# Patient Record
Sex: Male | Born: 1960 | Race: White | Hispanic: No | Marital: Married
Health system: Southern US, Community
[De-identification: ages and names within clinical notes are randomized; demographics above are authoritative.]

## PROBLEM LIST (undated history)

## (undated) DIAGNOSIS — I10 Essential (primary) hypertension: Secondary | ICD-10-CM

## (undated) DIAGNOSIS — I639 Cerebral infarction, unspecified: Secondary | ICD-10-CM

## (undated) HISTORY — PX: REPLACEMENT TOTAL KNEE: SUR1224

---

## 2020-08-08 ENCOUNTER — Emergency Department: Payer: No Typology Code available for payment source

## 2020-08-08 ENCOUNTER — Emergency Department
Admission: EM | Admit: 2020-08-08 | Discharge: 2020-08-09 | Disposition: A | Discharge status: Home / Self Care | Payer: No Typology Code available for payment source | Attending: Emergency Medicine | Admitting: Emergency Medicine

## 2020-08-08 ENCOUNTER — Other Ambulatory Visit: Payer: Self-pay

## 2020-08-08 DIAGNOSIS — Z9884 Bariatric surgery status: Secondary | ICD-10-CM | POA: Diagnosis not present

## 2020-08-08 DIAGNOSIS — Z8679 Personal history of other diseases of the circulatory system: Secondary | ICD-10-CM | POA: Insufficient documentation

## 2020-08-08 DIAGNOSIS — R109 Unspecified abdominal pain: Secondary | ICD-10-CM

## 2020-08-08 DIAGNOSIS — K7460 Unspecified cirrhosis of liver: Secondary | ICD-10-CM

## 2020-08-08 DIAGNOSIS — R1013 Epigastric pain: Secondary | ICD-10-CM

## 2020-08-08 LAB — HEPATIC FUNCTION PANEL
ALT: 38 U/L (ref 0–44)
AST: 81 U/L — ABNORMAL HIGH (ref 15–41)
Albumin: 3.7 g/dL (ref 3.5–5.0)
Alkaline Phosphatase: 149 U/L — ABNORMAL HIGH (ref 38–126)
Bilirubin, Direct: 0.2 mg/dL (ref 0.0–0.2)
Indirect Bilirubin: 0.6 mg/dL (ref 0.3–0.9)
Total Bilirubin: 0.8 mg/dL (ref 0.3–1.2)
Total Protein: 7.4 g/dL (ref 6.5–8.1)

## 2020-08-08 LAB — CBC
HCT: 43.6 % (ref 39.0–52.0)
Hemoglobin: 14.4 g/dL (ref 13.0–17.0)
MCH: 29.1 pg (ref 26.0–34.0)
MCHC: 33 g/dL (ref 30.0–36.0)
MCV: 88.3 fL (ref 80.0–100.0)
Platelets: 265 10*3/uL (ref 150–400)
RBC: 4.94 MIL/uL (ref 4.22–5.81)
RDW: 13.6 % (ref 11.5–15.5)
WBC: 16.2 10*3/uL — ABNORMAL HIGH (ref 4.0–10.5)
nRBC: 0 % (ref 0.0–0.2)

## 2020-08-08 LAB — BASIC METABOLIC PANEL
Anion gap: 10 (ref 5–15)
BUN: 32 mg/dL — ABNORMAL HIGH (ref 8–23)
CO2: 24 mmol/L (ref 22–32)
Calcium: 8.7 mg/dL — ABNORMAL LOW (ref 8.9–10.3)
Chloride: 105 mmol/L (ref 98–111)
Creatinine, Ser: 1.1 mg/dL (ref 0.61–1.24)
GFR, Estimated: >60 mL/min (ref >60)
Glucose, Bld: 93 mg/dL (ref 70–99)
Potassium: 3.2 mmol/L — ABNORMAL LOW (ref 3.5–5.1)
Sodium: 139 mmol/L (ref 135–145)

## 2020-08-08 LAB — TROPONIN I (HIGH SENSITIVITY): Troponin I (High Sensitivity): 5 ng/L (ref <18)

## 2020-08-08 LAB — LIPASE, BLOOD: Lipase: 75 U/L — ABNORMAL HIGH (ref 11–51)

## 2020-08-08 MED ORDER — IOHEXOL 350 MG/ML SOLN
100.0000 mL | Freq: Once | INTRAVENOUS | Status: AC | PRN
  Administered 2020-08-08: 100 mL via INTRAVENOUS

## 2020-08-08 NOTE — ED Provider Notes (Signed)
[facility] Emergency Department Provider Note    ____________________________________________   I have reviewed the triage vital signs and the nursing notes.   HISTORY  Chief Complaint Abdominal Pain   History limited by: Not Limited   HPI Julian Villa is a 59 y.o. male who presents to the emergency department today because of concerns for abdominal pain.  Located in the epigastric region.  He states it came on suddenly roughly 30 minutes prior to arrival.  It was severe.  He denied any associated nausea or vomiting.  Denied any associated shortness of breath.  Was given pain medication by EMS in the time my exam stated that he felt significant improvement.  He does have a history of AAA and states that has not been repaired.  Also has a history of gastric bypass and gastritis causing blood loss and requiring transfusion secondary to over-the-counter pain medication use.Marland Kitchen  Recently moved to the area from [place] where he had gotten his care in the past.  He has not yet established care here although states he has an appointment with his new doctor in 2 days. Denies any recent OTC pain medication use. Did have some alcohol recently.   No previous medical records in [facility] EMR  No past medical history on file.  There are no problems to display for this patient.   Prior to Admission medications   Not on File    Allergies Patient has no known allergies.  No family history on file.  Social History Social History   Tobacco Use  . Smoking status: Not on file  Substance Use Topics  . Alcohol use: Not on file  . Drug use: Not on file    Review of Systems Constitutional: No fever/chills Eyes: No visual changes. ENT: No sore throat. Cardiovascular: Denies chest pain. Respiratory: Denies shortness of breath. Gastrointestinal: Positive for abdominal pain. No nausea, no vomiting.  No diarrhea.   Genitourinary: Negative for dysuria. Musculoskeletal:  Negative for back pain. Skin: Negative for rash. Neurological: Negative for headaches, focal weakness or numbness.  ____________________________________________   PHYSICAL EXAM:  VITAL SIGNS: ED Triage Vitals  Enc Vitals Group     BP 08/08/20 2211 106/76     Pulse Rate 08/08/20 2208 69     Resp 08/08/20 2208 16     Temp 08/08/20 2211 97.6 F (36.4 C)     Temp Source 08/08/20 2208 Oral     SpO2 08/08/20 2211 97 %     Weight 08/08/20 2208 223 lb (101.2 kg)     Height 08/08/20 2208 5\' 11"  (1.803 m)     Head Circumference --      Peak Flow --      Pain Score 08/08/20 2208 3   Constitutional: Alert and oriented.  Eyes: Conjunctivae are normal.  ENT      Head: Normocephalic and atraumatic.      Nose: No congestion/rhinnorhea.      Mouth/Throat: Mucous membranes are moist.      Neck: No stridor. Hematological/Lymphatic/Immunilogical: No cervical lymphadenopathy. Cardiovascular: Normal rate, regular rhythm.  No murmurs, rubs, or gallops.  Respiratory: Normal respiratory effort without tachypnea nor retractions. Breath sounds are clear and equal bilaterally. No wheezes/rales/rhonchi. Gastrointestinal: Soft and non tender. No rebound. No guarding.  Genitourinary: Deferred Musculoskeletal: Normal range of motion in all extremities. No lower extremity edema. Neurologic:  Normal speech and language. No gross focal neurologic deficits are appreciated.  Skin:  Skin is warm, dry and intact. No rash  noted. Psychiatric: Mood and affect are normal. Speech and behavior are normal. Patient exhibits appropriate insight and judgment.  ____________________________________________    LABS (pertinent positives/negatives)  CBC wbc 16.2, hgb 14.4, plt 265 BMP na 139, k 3.2, cr 1.10 Trop hs 5 ____________________________________________   EKG  I, Phineas Semen, attending physician, personally viewed and interpreted this EKG  EKG Time: 2209 Rate: 69 Rhythm: atrial fibrillation Axis:  left axis deviation Intervals: qtc 459 QRS: LAFB ST changes: no st elevation Impression: abnormal ekg   ____________________________________________    RADIOLOGY  CXR No active disease  ____________________________________________   PROCEDURES  Procedures  ____________________________________________   INITIAL IMPRESSION / ASSESSMENT AND PLAN / ED COURSE  Pertinent labs & imaging results that were available during my care of the patient were reviewed by me and considered in my medical decision making (see chart for details).   Patient presented to the emergency department today because of concerns for abdominal pain.  The patient states that the pain started suddenly.  However by the time my exam the pain essentially resolved.  Had received some opioids by EMS.  Nontender abdomen.  Blood work shows a mild leukocytosis.  Given history of AAA will get CT imaging to evaluate.  Would also like to evaluate abdomen given history of gastric bypass.  ____________________________________________   FINAL CLINICAL IMPRESSION(S) / ED DIAGNOSES  Final diagnoses:  Abdominal pain, unspecified abdominal location     Note: This dictation was prepared with Dragon dictation. Any transcriptional errors that result from this process are unintentional     Phineas Semen, MD 08/08/20 2308

## 2020-08-08 NOTE — ED Triage Notes (Signed)
Pt with sudden onset of upper mid abd pain approx 30 min pta. Per ems pt with history of AAA. Pt received fentanyl with pain improvement. Pt denies shob, dizziness, nausea

## 2020-08-08 NOTE — ED Notes (Signed)
Pt updated on ct process.

## 2020-08-09 LAB — TROPONIN I (HIGH SENSITIVITY): Troponin I (High Sensitivity): 6 ng/L (ref <18)

## 2020-08-09 NOTE — ED Notes (Signed)
Pt up to void.

## 2020-08-09 NOTE — ED Provider Notes (Signed)
Accepted care of this patient from Dr. Derrill Kay at 11 PM.  Patient presented with sudden onset of epigastric abdominal pain which had resolved upon arrival to the emergency room.  Patient had history of AAA and therefore CT was pending.  I was asked to follow-up the results of his CT and reassess patient.  CT is negative for any evidence of AAA, dissection, or any other aneurysm.  Patient does have findings consistent with cirrhosis of the liver.  There is no history of alcohol abuse.  This findings were discussed with patient who has an appointment coming up with his primary care doctor this week.  Recommended follow-up with the PCP for further work-up of this finding.  His blood work was also reviewed by me with leukocytosis with white count of 16, AST of 81, lipase of 75.  Unfortunately since patient just recently moved here from who I am unable to locate his old medical records.  Patient denies any known history of cirrhosis of the liver.  He remains with no further episodes of pain, abdomen is soft and nontender.  I did do a second troponin which was also negative.  Therefore full resolution of his symptoms and a negative CT patient is stable for discharge with outpatient follow-up.  Plan was discussed with patient and his wife was at bedside.  Discussed my standard return precautions with both of them.    I have personally reviewed the images performed during this visit and I agree with the Radiologist's read.   Interpretation by Radiologist:  DG Chest 1 View  Result Date: 08/08/2020 CLINICAL DATA:  Sudden onset upper mid abdominal pain EXAM: CHEST  1 VIEW COMPARISON:  None. FINDINGS: The heart size and mediastinal contours are within normal limits. Both lungs are clear. The visualized skeletal structures are unremarkable. IMPRESSION: No active disease. Electronically Signed   By: Sharlet Salina M.D.   On: 08/08/2020 22:32   CT Angio Abd/Pel W and/or Wo Contrast  Result Date: 08/08/2020 CLINICAL  DATA:  Abdominal pain. EXAM: CTA ABDOMEN AND PELVIS WITHOUT AND WITH CONTRAST TECHNIQUE: Multidetector CT imaging of the abdomen and pelvis was performed using the standard protocol during bolus administration of intravenous contrast. Multiplanar reconstructed images and MIPs were obtained and reviewed to evaluate the vascular anatomy. CONTRAST:  OMNIPAQUE IOHEXOL 350 MG/ML SOLN COMPARISON:  None. FINDINGS: VASCULAR Aorta: There are diffuse atherosclerotic changes of the abdominal aorta. There is no evidence for an abdominal aortic aneurysm or dissection. Celiac: Patent without evidence of aneurysm, dissection, vasculitis or significant stenosis. SMA: Patent without evidence of aneurysm, dissection, vasculitis or significant stenosis. There is a replaced right hepatic artery, a normal variant. Renals: Both renal arteries are patent without evidence of aneurysm, dissection, vasculitis, fibromuscular dysplasia or significant stenosis. There are 2 left renal arteries. IMA: Patent without evidence of aneurysm, dissection, vasculitis or significant stenosis. Inflow: Patent without evidence of aneurysm, dissection, vasculitis or significant stenosis. Proximal Outflow: Bilateral common femoral and visualized portions of the superficial and profunda femoral arteries are patent without evidence of aneurysm, dissection, vasculitis or significant stenosis. Veins: No obvious venous abnormality within the limitations of this arterial phase study. Review of the MIP images confirms the above findings. NON-VASCULAR Lower chest: The lung bases are clear.There is significant cardiomegaly. Hepatobiliary: The liver surface is nodular. There is a 2.5 cm lesion in hepatic segment 7 (axial series 4, image 47). This lesion appears to demonstrate discontinuous peripheral enhancement on the arterial phase but drops below the normal liver  parenchyma attenuation on the portal venous phase. This is most likely to represent a benign  hemangioma. Status post cholecystectomy.There is no biliary ductal dilation. Pancreas: Normal contours without ductal dilatation. No peripancreatic fluid collection. Spleen: Unremarkable. Adrenals/Urinary Tract: --Adrenal glands: Unremarkable. --Right kidney/ureter: No hydronephrosis or radiopaque kidney stones. --Left kidney/ureter: No hydronephrosis or radiopaque kidney stones. There is a cyst arising from the left kidney measuring approximately 6 cm. --Urinary bladder: Unremarkable. Stomach/Bowel: --Stomach/Duodenum: Are postsurgical changes of the stomach consistent with a prior gastric bypass. --Small bowel: Unremarkable. --Colon: Unremarkable. --Appendix: Normal. Lymphatic: --No retroperitoneal lymphadenopathy. --No mesenteric lymphadenopathy. --No pelvic or inguinal lymphadenopathy. Reproductive: Unremarkable Other: No ascites or free air. The abdominal wall is normal. Musculoskeletal. No acute displaced fractures. IMPRESSION: 1. No acute vascular abnormality. There is no evidence for an abdominal aortic aneurysm or dissection. 2. Cirrhosis. There is a probable 2.5 cm hemangioma in the right hepatic lobe. A 6 month follow-up ultrasound would be useful to confirm stability of this lesion given the underlying cirrhosis. 3. There is significant cardiomegaly. 4. Status post gastric bypass without evidence for an obstruction. Aortic Atherosclerosis (ICD10-I70.0). Electronically Signed   By: Katherine Mantle M.D.   On: 08/08/2020 23:34      Nita Sickle, MD 08/09/20 6283

## 2020-08-09 NOTE — Discharge Instructions (Addendum)
You have been seen in the Emergency Department (ED) for abdominal pain.  As we discussed, your CT scan shows findings of cirrhosis of the liver.  Make sure to discuss that with your primary care doctor in your upcoming appointment for further evaluation.  Abdominal pain has many possible causes. Some aren't serious and get better on their own in a few days. Others need more testing and treatment. If your pain continues or gets worse, you need to be rechecked and may need more tests to find out what is wrong. You may need surgery to correct the problem.   Follow up with your doctor in 12-24 hours if you are still having abdominal pain. Otherwise follow up in 1-3 days for a re-check  Don't ignore new symptoms, such as fever, nausea and vomiting, new or worsening abdominal pain, urination problems, bloody diarrhea or bloody stools, black tarry stools, uncontrollable nausea and vomiting, and dizziness. These may be signs of a more serious problem. If you develop any of these you should be seen by your doctor immediately or return to the ED.   How can you care for yourself at home?  Rest until you feel better.  To prevent dehydration, drink plenty of fluids, enough so that your urine is light yellow or clear like water. Choose water and other caffeine-free clear liquids until you feel better. If you have kidney, heart, or liver disease and have to limit fluids, talk with your doctor before you increase the amount of fluids you drink.  If your stomach is upset, eat mild foods, such as rice, dry toast or crackers, bananas, and applesauce. Try eating several small meals instead of two or three large ones.  Wait until 48 hours after all symptoms have gone away before you have spicy foods, alcohol, and drinks that contain caffeine.  Do not eat foods that are high in fat.  Avoid anti-inflammatory medicines such as aspirin, ibuprofen (Advil, Motrin), and naproxen (Aleve). These can cause stomach upset. Talk to  your doctor if you take daily aspirin for another health problem.  When should you call for help?  Call 911 anytime you think you may need emergency care. For example, call if:  You passed out (lost consciousness).  You pass maroon or very bloody stools.  You vomit blood or what looks like coffee grounds.  You have new, severe belly pain.  Call your doctor now or seek immediate medical care if:  Your pain gets worse, especially if it becomes focused in one area of your belly.  You have a new or higher fever.  Your stools are black and look like tar, or they have streaks of blood.  You have unexpected vaginal bleeding.  You have symptoms of a urinary tract infection. These may include:  Pain when you urinate.  Urinating more often than usual.  Blood in your urine. You are dizzy or lightheaded, or you feel like you may faint. Watch closely for changes in your health, and be sure to contact your doctor if:  You are not getting better after 1 day (24 hours).

## 2020-08-17 ENCOUNTER — Other Ambulatory Visit: Payer: Self-pay | Admitting: Internal Medicine

## 2020-08-19 ENCOUNTER — Other Ambulatory Visit: Payer: Self-pay | Admitting: Internal Medicine

## 2020-08-19 DIAGNOSIS — I77810 Thoracic aortic ectasia: Secondary | ICD-10-CM

## 2020-08-24 ENCOUNTER — Ambulatory Visit: Payer: No Typology Code available for payment source

## 2020-09-01 ENCOUNTER — Ambulatory Visit: Payer: No Typology Code available for payment source

## 2020-09-01 ENCOUNTER — Other Ambulatory Visit: Payer: Self-pay

## 2020-09-01 ENCOUNTER — Ambulatory Visit
Admission: RE | Admit: 2020-09-01 | Discharge: 2020-09-01 | Disposition: A | Discharge status: Home / Self Care | Payer: No Typology Code available for payment source | Source: Ambulatory Visit | Attending: Internal Medicine | Admitting: Internal Medicine

## 2020-09-01 DIAGNOSIS — I77810 Thoracic aortic ectasia: Secondary | ICD-10-CM | POA: Insufficient documentation

## 2020-09-01 HISTORY — DX: Essential (primary) hypertension: I10

## 2020-09-01 MED ORDER — IOHEXOL 350 MG/ML SOLN
100.0000 mL | Freq: Once | INTRAVENOUS | Status: AC | PRN
  Administered 2020-09-01: 100 mL via INTRAVENOUS

## 2020-09-22 ENCOUNTER — Other Ambulatory Visit: Payer: Self-pay

## 2020-09-22 ENCOUNTER — Emergency Department: Payer: No Typology Code available for payment source

## 2020-09-22 ENCOUNTER — Encounter: Payer: Self-pay | Admitting: Emergency Medicine

## 2020-09-22 ENCOUNTER — Emergency Department
Admission: EM | Admit: 2020-09-22 | Discharge: 2020-09-22 | Disposition: A | Discharge status: Home / Self Care | Payer: No Typology Code available for payment source | Attending: Emergency Medicine | Admitting: Emergency Medicine

## 2020-09-22 DIAGNOSIS — I10 Essential (primary) hypertension: Secondary | ICD-10-CM | POA: Diagnosis not present

## 2020-09-22 DIAGNOSIS — S82832A Other fracture of upper and lower end of left fibula, initial encounter for closed fracture: Secondary | ICD-10-CM | POA: Insufficient documentation

## 2020-09-22 DIAGNOSIS — Y92520 Airport as the place of occurrence of the external cause: Secondary | ICD-10-CM | POA: Diagnosis not present

## 2020-09-22 DIAGNOSIS — W009XXA Unspecified fall due to ice and snow, initial encounter: Secondary | ICD-10-CM | POA: Diagnosis not present

## 2020-09-22 DIAGNOSIS — Z79899 Other long term (current) drug therapy: Secondary | ICD-10-CM | POA: Diagnosis not present

## 2020-09-22 DIAGNOSIS — Z96651 Presence of right artificial knee joint: Secondary | ICD-10-CM | POA: Diagnosis not present

## 2020-09-22 DIAGNOSIS — S8252XA Displaced fracture of medial malleolus of left tibia, initial encounter for closed fracture: Secondary | ICD-10-CM | POA: Insufficient documentation

## 2020-09-22 DIAGNOSIS — S99912A Unspecified injury of left ankle, initial encounter: Secondary | ICD-10-CM | POA: Diagnosis present

## 2020-09-22 HISTORY — DX: Cerebral infarction, unspecified: I63.9

## 2020-09-22 MED ORDER — OXYCODONE-ACETAMINOPHEN 7.5-325 MG PO TABS
1.0000 | ORAL_TABLET | Freq: Four times a day (QID) | ORAL | 0 refills | Status: AC | PRN
Start: 2020-09-22 — End: 2021-09-22

## 2020-09-22 MED ORDER — OXYCODONE-ACETAMINOPHEN 7.5-325 MG PO TABS
1.0000 | ORAL_TABLET | Freq: Once | ORAL | Status: AC
  Administered 2020-09-22: 1 via ORAL
  Filled 2020-09-22: qty 1

## 2020-09-22 NOTE — ED Notes (Signed)
See triage note  Presents s/p fall this am  [place]  Injury to left ankle  Ankle swollen  Tender to touch  Pos pulses

## 2020-09-22 NOTE — ED Provider Notes (Signed)
[facility] Emergency Department Provider Note  ____________________________________________   None    (approximate)  I have reviewed the triage vital signs and the nursing notes.   HISTORY  Chief Complaint Ankle Pain   HPI Julian Villa is a 60 y.o. male presents to the ED with complaint of left ankle pain.  Patient states that he slipped on ice getting out of the car at the [place] airport.  He denies any head injury or loss of consciousness.  He denies any other injuries.  Patient states that he already has some chronic problems with his left foot.  Patient has continued to ambulate with his chronic foot issues but was told that he would need a fusion in the future.  He rates his pain as 7 out of 10.      Past Medical History:  Diagnosis Date  . Hypertension   . Stroke [facility])     There are no problems to display for this patient.   Past Surgical History:  Procedure Laterality Date  . REPLACEMENT TOTAL KNEE Right     Prior to Admission medications   Medication Sig Start Date End Date Taking? Authorizing Provider  amLODipine (NORVASC) 10 MG tablet Take 10 mg by mouth daily.   Yes [provider]  apixaban (ELIQUIS) 5 MG TABS tablet Take 5 mg by mouth 2 (two) times daily.   Yes [provider]  atorvastatin (LIPITOR) 80 MG tablet Take 80 mg by mouth daily.   Yes [provider]  hydrochlorothiazide (HYDRODIURIL) 25 MG tablet Take 25 mg by mouth daily.   Yes [provider]  levothyroxine (SYNTHROID) 100 MCG tablet Take 100 mcg by mouth daily before breakfast.   Yes [provider]  lisinopril (ZESTRIL) 40 MG tablet Take 40 mg by mouth daily.   Yes [provider]  metoprolol succinate (TOPROL-XL) 100 MG 24 hr tablet Take 100 mg by mouth daily. Take with or immediately following a meal.   Yes [provider]  NIFEdipine (ADALAT CC) 60 MG 24 hr tablet Take 60 mg by mouth daily.   Yes  [provider]  oxyCODONE-acetaminophen (PERCOCET) 7.5-325 MG tablet Take 1 tablet by mouth every 6 (six) hours as needed for severe pain. 09/22/20 09/22/21 Yes Tommi Rumps, PA-C    Allergies Patient has no known allergies.  No family history on file.  Social History  Review of Systems Constitutional: No fever/chills Eyes: No visual changes. ENT: No trauma. Cardiovascular: Denies chest pain. Respiratory: Denies shortness of breath. Gastrointestinal: No abdominal pain.  No nausea, no vomiting.   Musculoskeletal: Positive for left ankle pain. Skin: Negative for rash. Neurological: Negative for headaches, focal weakness or numbness. ____________________________________________   PHYSICAL EXAM:  VITAL SIGNS: ED Triage Vitals  Enc Vitals Group     BP 09/22/20 0625 (!) 142/87     Pulse Rate 09/22/20 0625 87     Resp 09/22/20 0625 18     Temp 09/22/20 0625 (!) 97.4 F (36.3 C)     Temp Source 09/22/20 0625 Oral     SpO2 09/22/20 0625 100 %     Weight 09/22/20 0624 223 lb (101.2 kg)     Height 09/22/20 0624 6' (1.829 m)     Head Circumference --      Peak Flow --      Pain Score 09/22/20 0624 7     Pain Loc --      Pain Edu? --  Excl. in GC? --     Constitutional: Alert and oriented. Well appearing and in no acute distress. Eyes: Conjunctivae are normal. PERRL. EOMI. Head: Atraumatic. Nose: No trauma. Neck: No stridor.   Cardiovascular: Normal rate, regular rhythm. Grossly normal heart sounds.  Good peripheral circulation. Respiratory: Normal respiratory effort.  No retractions. Lungs CTAB. Gastrointestinal: Soft and nontender. No distention.  Musculoskeletal: On examination of the left ankle there is marked soft tissue edema present especially medially.  Marked tenderness to palpation.  Range of motion is restricted secondary to pain.  Skin is intact.  Motor and sensory function intact.  DP and PT pulses are palpable and also confirmed by  Doppler. Neurologic:  Normal speech and language. No gross focal neurologic deficits are appreciated. Skin:  Skin is warm, dry and intact. No rash noted. Psychiatric: Mood and affect are normal. Speech and behavior are normal.  ____________________________________________   LABS (all labs ordered are listed, but only abnormal results are displayed)  Labs Reviewed - No data to display ____________________________________________  RADIOLOGY I, Tommi Rumps, personally viewed and evaluated these images (plain radiographs) as part of my medical decision making, as well as reviewing the written report by the radiologist.   Official radiology report(s): DG Ankle Complete Left  Result Date: 09/22/2020 CLINICAL DATA:  Fall. EXAM: LEFT ANKLE COMPLETE - 3+ VIEW COMPARISON:  No prior. FINDINGS: Diffuse soft tissue swelling. Slightly displaced fractures noted of the medial malleolus and distal left fibula. Mild degenerative change. Peripheral vascular calcification. IMPRESSION: 1. Slightly displaced fractures noted of the medial malleolus and distal left fibula. 2. Peripheral vascular disease. Electronically Signed   By: Maisie Fus  Register   On: 09/22/2020 07:23    ____________________________________________   PROCEDURES  Procedure(s) performed (including Critical Care):  Procedures OCL posterior splint was applied by ED tech.  ____________________________________________   INITIAL IMPRESSION / ASSESSMENT AND PLAN / ED COURSE  As part of my medical decision making, I reviewed the following data within the electronic MEDICAL RECORD NUMBER Notes from prior ED visits and [place] Controlled Substance Database  60 year old male presents to the ED with complaint of left ankle pain after [place] ice at the airport this [place].  Patient denies any head injury and states that this is his only injury.  On physical exam patient's left ankle shows moderate soft tissue edema and markedly tender to  palpation.  X-ray shows a slightly displaced medial malleolar fracture and a closed fracture of the distal shaft of the fibula.  Patient was made aware.  After secure chat with Dr. Allena Katz, Dr. Excell Seltzer will be taking care of this patient.  Patient was placed in a posterior OCL after pulses were confirmed by Doppler.  Was given a walker while in the emergency department.  He is aware that he is nonweightbearing until he has been seen by the podiatrist and arrangements for scheduling surgery will take place next week.  Wife is aware that they will need to call the office to get an appointment time to discuss this.  In the meantime he is to ice and elevate.  A prescription for Percocet was sent to his pharmacy with instructions that this could cause drowsiness and increase his risk for injury.  ____________________________________________   FINAL CLINICAL IMPRESSION(S) / ED DIAGNOSES  Final diagnoses:  Closed displaced fracture of medial malleolus of left tibia, initial encounter  Other closed fracture of distal end of left fibula, initial encounter     ED Discharge Orders  Ordered    oxyCODONE-acetaminophen (PERCOCET) 7.5-325 MG tablet  Every 6 hours PRN        09/22/20 0913          *Please note:  Myer Bohlman was evaluated in Emergency Department on 09/22/2020 for the symptoms described in the history of present illness. He was evaluated in the context of the global COVID-19 pandemic, which necessitated consideration that the patient might be at risk for infection with the SARS-CoV-2 virus that causes COVID-19. Institutional protocols and algorithms that pertain to the evaluation of patients at risk for COVID-19 are in a state of rapid change based on information released by regulatory bodies including the CDC and federal and state organizations. These policies and algorithms were followed during the patient's care in the ED.  Some ED evaluations and interventions may be delayed as a result of  limited staffing during and the pandemic.*   Note:  This document was prepared using Dragon voice recognition software and may include unintentional dictation errors.    Tommi Rumps, PA-C 09/22/20 1358    Minna Antis, MD 09/22/20 1458

## 2020-09-22 NOTE — Discharge Instructions (Addendum)
Call Dr. Bernette Redbird office for an appointment next week to discussed future surgery. No weightbearing on your left foot until released by Dr. Excell Seltzer. Use walker for added support and protection. You may loosen the Ace wrap if needed for swelling. Ice and elevation to reduce swelling. A prescription for pain medication was sent to your pharmacy. Percocet may cause drowsiness and increase your risk for falling.

## 2020-09-22 NOTE — ED Triage Notes (Signed)
Pt to triage via w/c; st slipped on ice and fell injuring left ankle PTA

## 2021-01-04 IMAGING — CT CT ANGIO CHEST
2 series · 18 of 32 positions shown · IV contrast (APPLIED)
Comparison: CT a abdomen/pelvis 08/08/2020

CLINICAL DATA: Dilated aortic root

EXAM:
CT ANGIOGRAPHY CHEST WITH CONTRAST
TECHNIQUE: Multidetector CT imaging of the chest was performed using the
standard protocol during bolus administration of intravenous
contrast. Multiplanar CT image reconstructions and MIPs were
obtained to evaluate the vascular anatomy.
CONTRAST:  100mL OMNIPAQUE IOHEXOL 350 MG/ML SOLN

[Series 4: axial arterial · axial · arterial · 0.81mm/px · z∈[-639,-357]mm · 14 of 167 slices shown]
[im 13/167  lung]
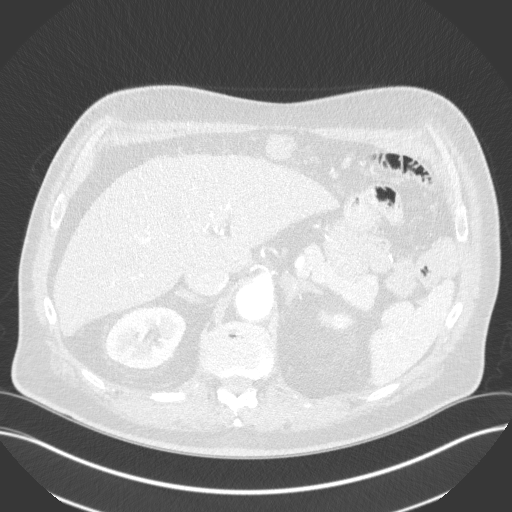
[im 26/167  mediastinal]
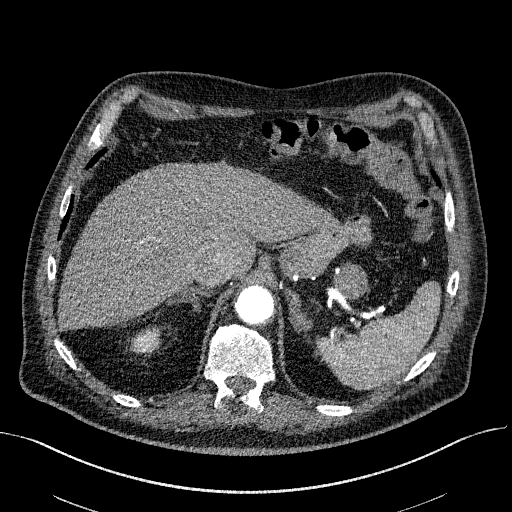
[im 39/167  lung]
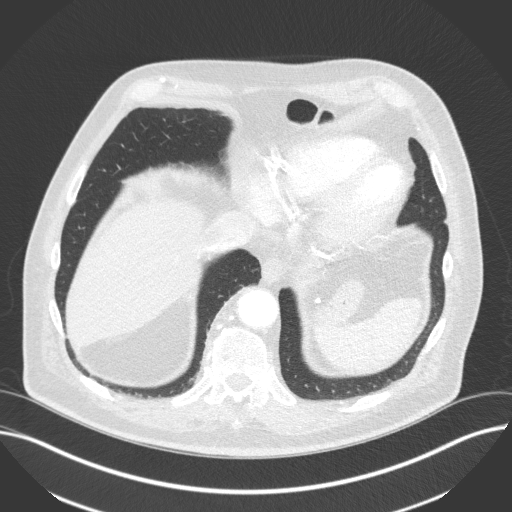
[im 52/167  mediastinal]
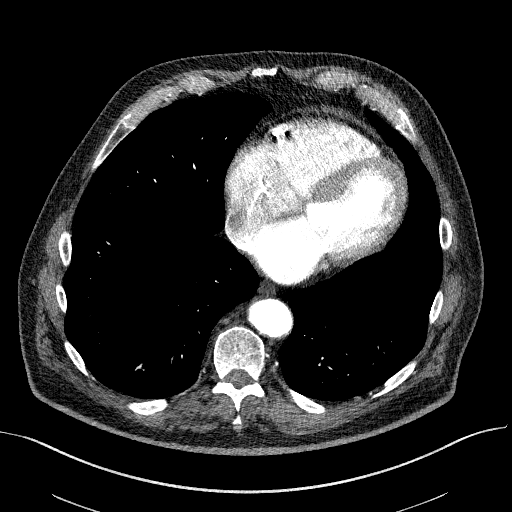
[im 64/167  lung]
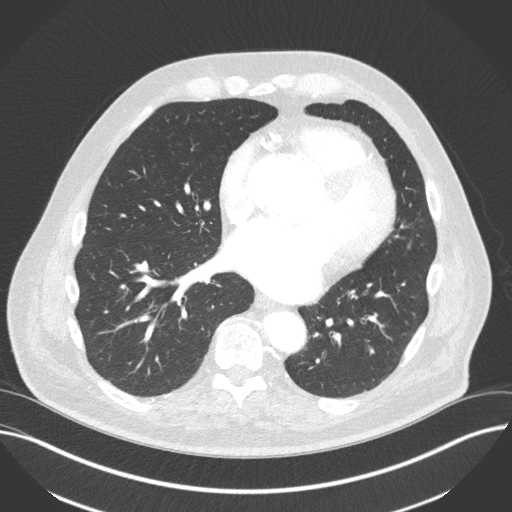
[im 77/167  mediastinal]
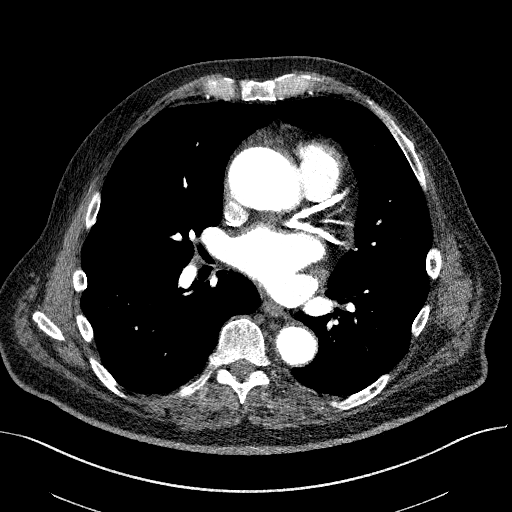
[im 79/167  lung]
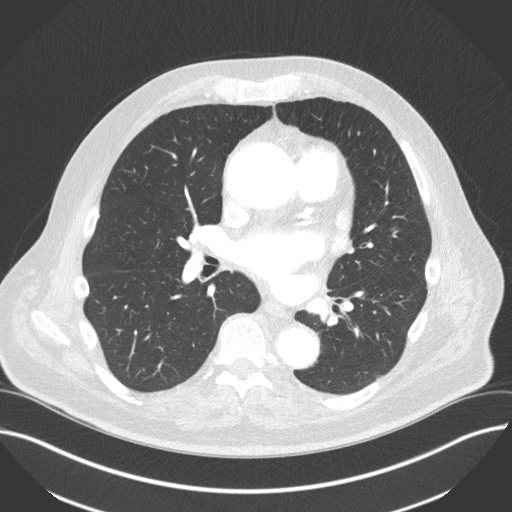
[im 84/167  mediastinal]
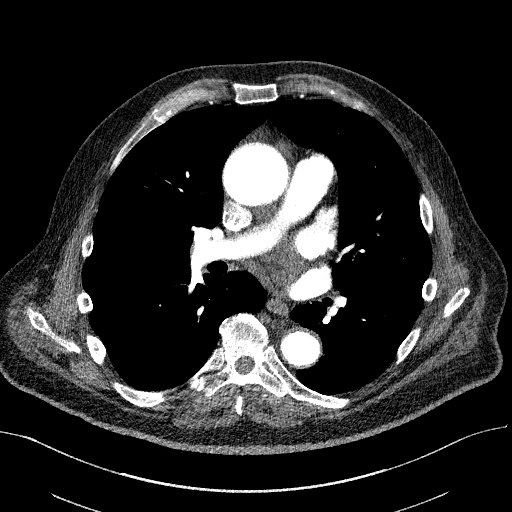
[im 90/167  lung]
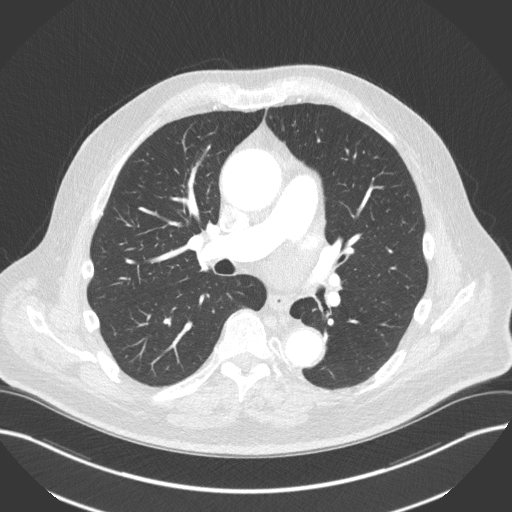
[im 103/167  mediastinal]
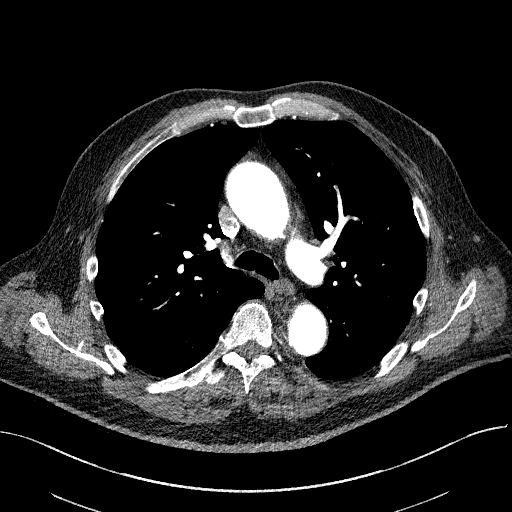
[im 115/167  lung]
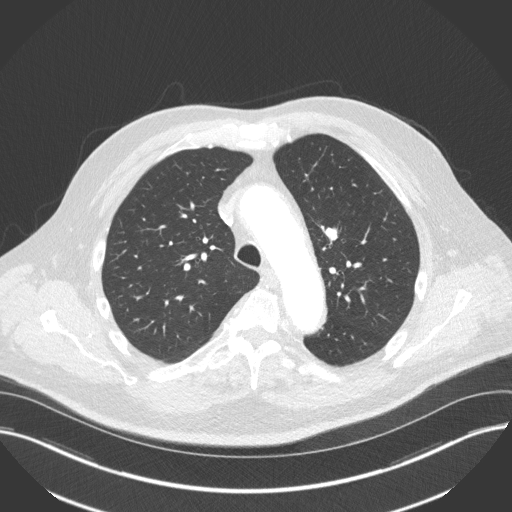
[im 128/167  mediastinal]
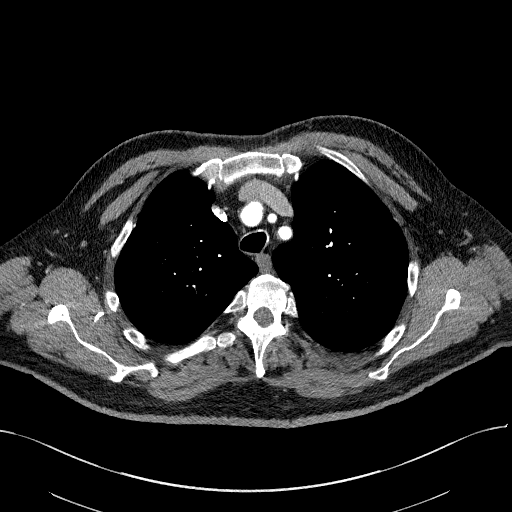
[im 141/167  lung]
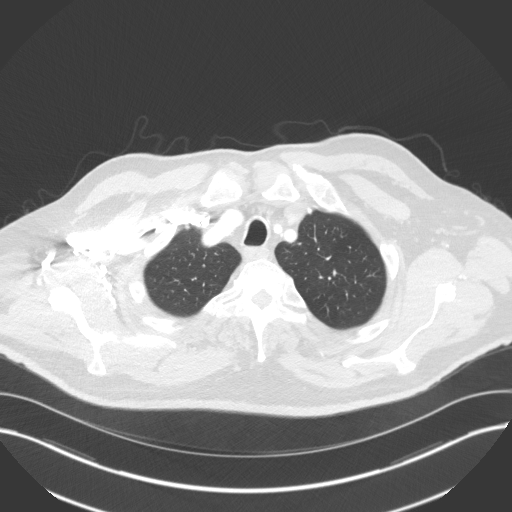
[im 154/167  mediastinal]
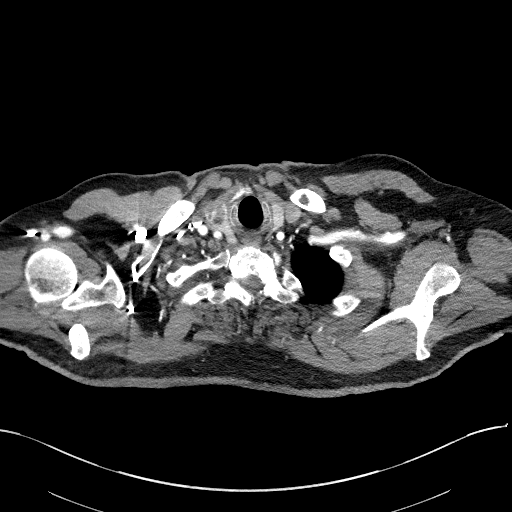

[Series 5: lung · axial · 0.81mm/px · z∈[-639,-507]mm · 4 of 167 slices shown]
[im 13/167  mediastinal]
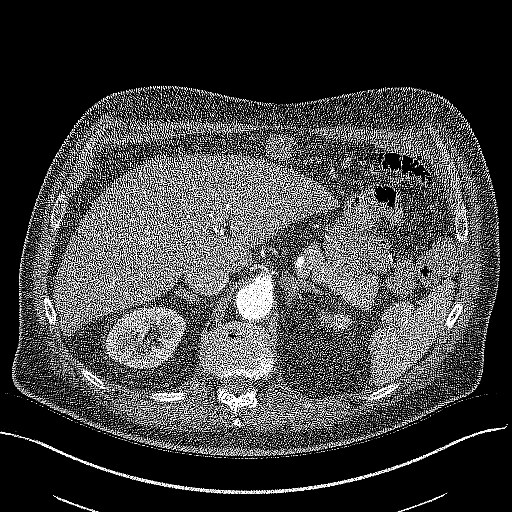
[im 39/167  mediastinal]
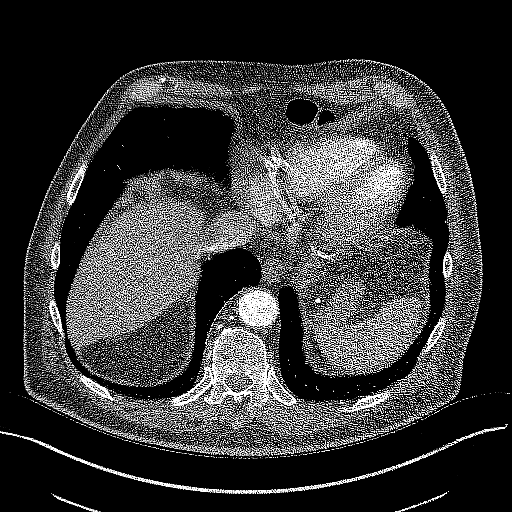
[im 64/167  mediastinal]
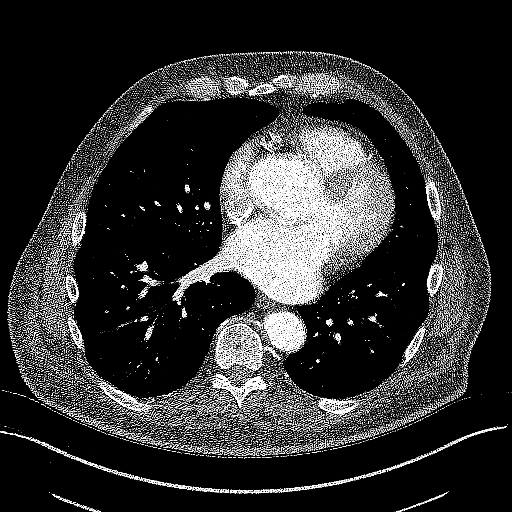
[im 79/167  mediastinal]
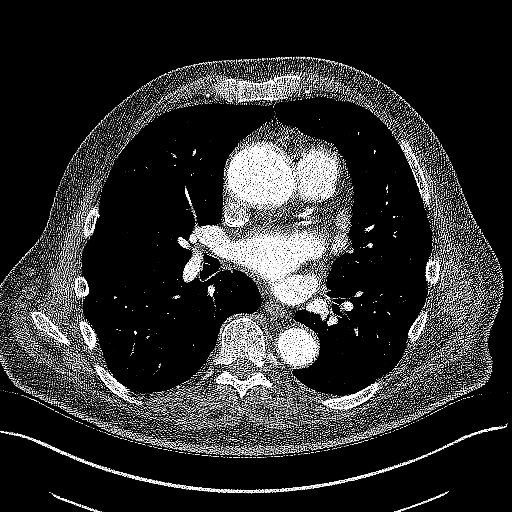

[18 of 32 positions shown; findings below may reference images not displayed]

FINDINGS: Cardiovascular: Conventional 3 vessel arch anatomy. The origin of
the right brachiocephalic artery is mildly aneurysmal at 2.0 cm. The
aortic root is mildly dilated at 4.7 cm. The tubular portion of the
ascending thoracic aorta is dilated at 4.9 cm. Borderline
cardiomegaly. Calcifications present throughout the coronary
arteries. Trace pericardial fluid versus thickening at the anterior
inferior aspect of the pericardium.

Mediastinum/Nodes: Small low-attenuation lesions are present in the
thyroid gland. None measure larger than 1.4 cm and therefore no
further follow-up is recommended. No suspicious mediastinal
lymphadenopathy or mass. There is prominence of the posterosuperior
pericardial recess which is an incidental finding of no clinical
significance.

Lungs/Pleura: The lungs are essentially clear. A small 4 mm nodule
is present in the left lower lobe (image 123, series 5). A tiny
calcified punctate granulomas present in the right lung apex. No
pleural effusion or pneumothorax.

Upper Abdomen: Surgical changes of prior gastric bypass procedure.
Adreniform thickening of both adrenal glands likely reflects
underlying adrenal hyperplasia. Subtle region of increased arterial
enhancement in the posterolateral aspect of hepatic segment 6. The
underlying liver morphology appears slightly cirrhotic. 5.4 cm
simple cyst exophytic from the upper pole of the left kidney.

Musculoskeletal: No acute fracture or aggressive appearing lytic or
blastic osseous lesion.

Review of the MIP images confirms the above findings.
IMPRESSION: 1. Fusiform aneurysmal dilation of the ascending thoracic aorta with
a maximal transverse diameter of 4.9 cm. Ascending thoracic aortic
aneurysm. Recommend semi-annual imaging followup by CTA or MRA and
referral to cardiothoracic surgery if not already obtained. This
recommendation follows 9575
ACCF/AHA/AATS/ACR/ASA/SCA/SHAFFLE/ANOUP/IMVU/ANIMATEUR Guidelines for the
Diagnosis and Management of Patients With Thoracic Aortic Disease.
Circulation. 9575; 121: E266-e369. Aortic aneurysm NOS ([id number])
2. Dilated aortic root with a maximal diameter of 4.7 cm measured at
the sinuses of Valsalva.
3. Mildly aneurysmal origin of the brachiocephalic artery at 2 cm.
4. Cardiomegaly.
5. Extensive coronary artery calcifications.
6. Incidental note is made of a small 4 mm nodule in the left lower
lobe. No follow-up needed if patient is low-risk. Non-contrast chest
CT can be considered in 12 months if patient is high-risk. This
recommendation follows the consensus statement: Guidelines for
Management of Incidental Pulmonary Nodules Detected on CT Images:
7. As noted on the recent prior CT arteriogram of the abdomen and
pelvis, there is a probable hemangioma in the posterolateral aspect
of hepatic segment 6. Consider follow-up ultrasound in 6 months.
8. Hepatic cirrhosis.

## 2021-01-25 IMAGING — CR DG ANKLE COMPLETE 3+V*L*
1 series · 3 of 3 positions shown · non-contrast
Comparison: No prior.

CLINICAL DATA: Fall.

EXAM:
LEFT ANKLE COMPLETE - 3+ VIEW

[Series 1: dg ankle complete left · 0.14mm/px · 3 of 3 slices shown]
[im 1/3]
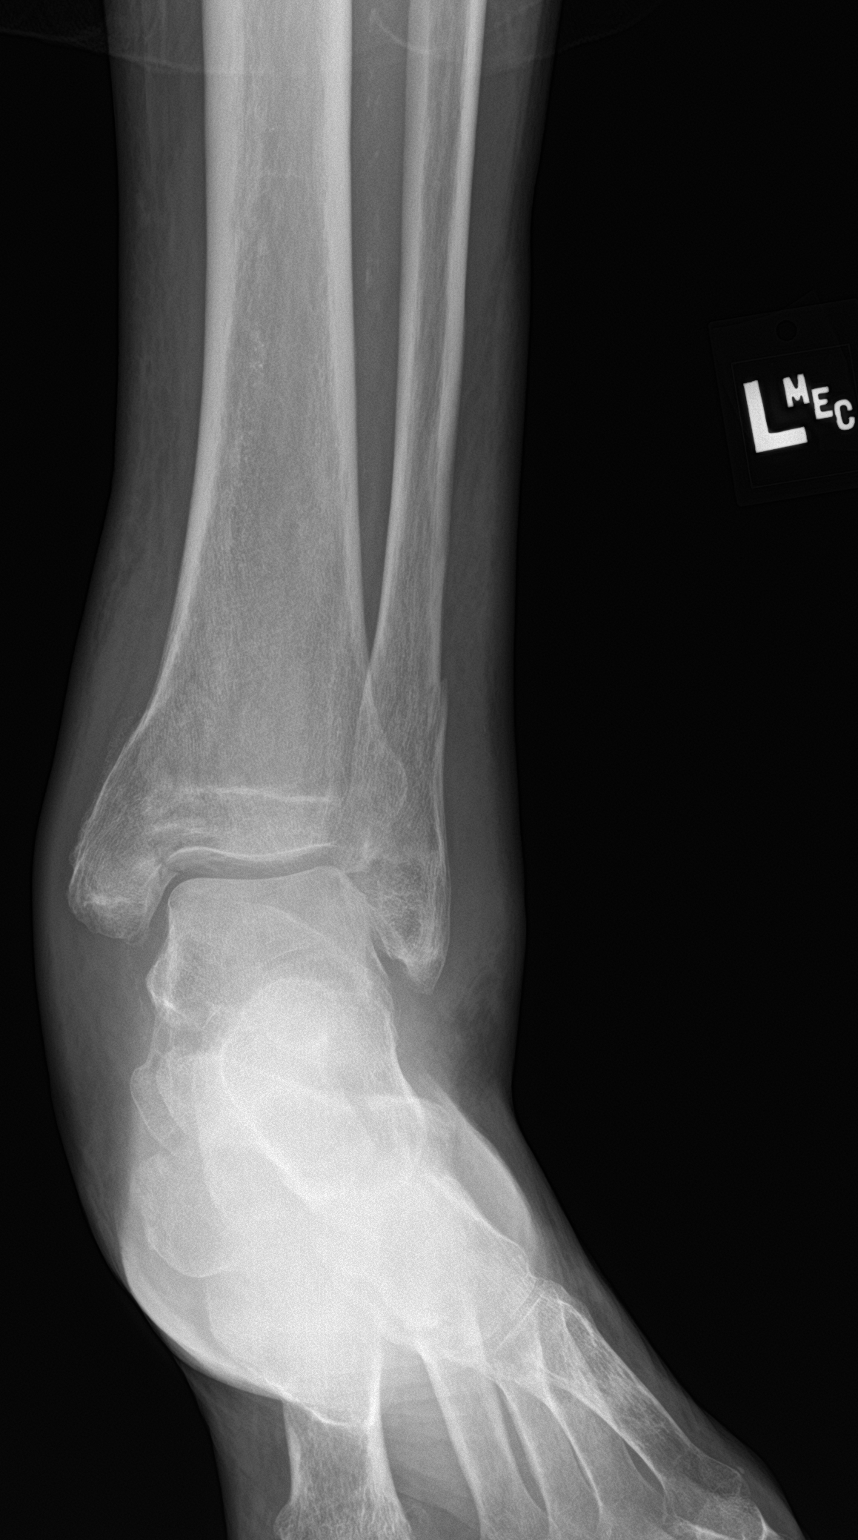
[im 2/3]
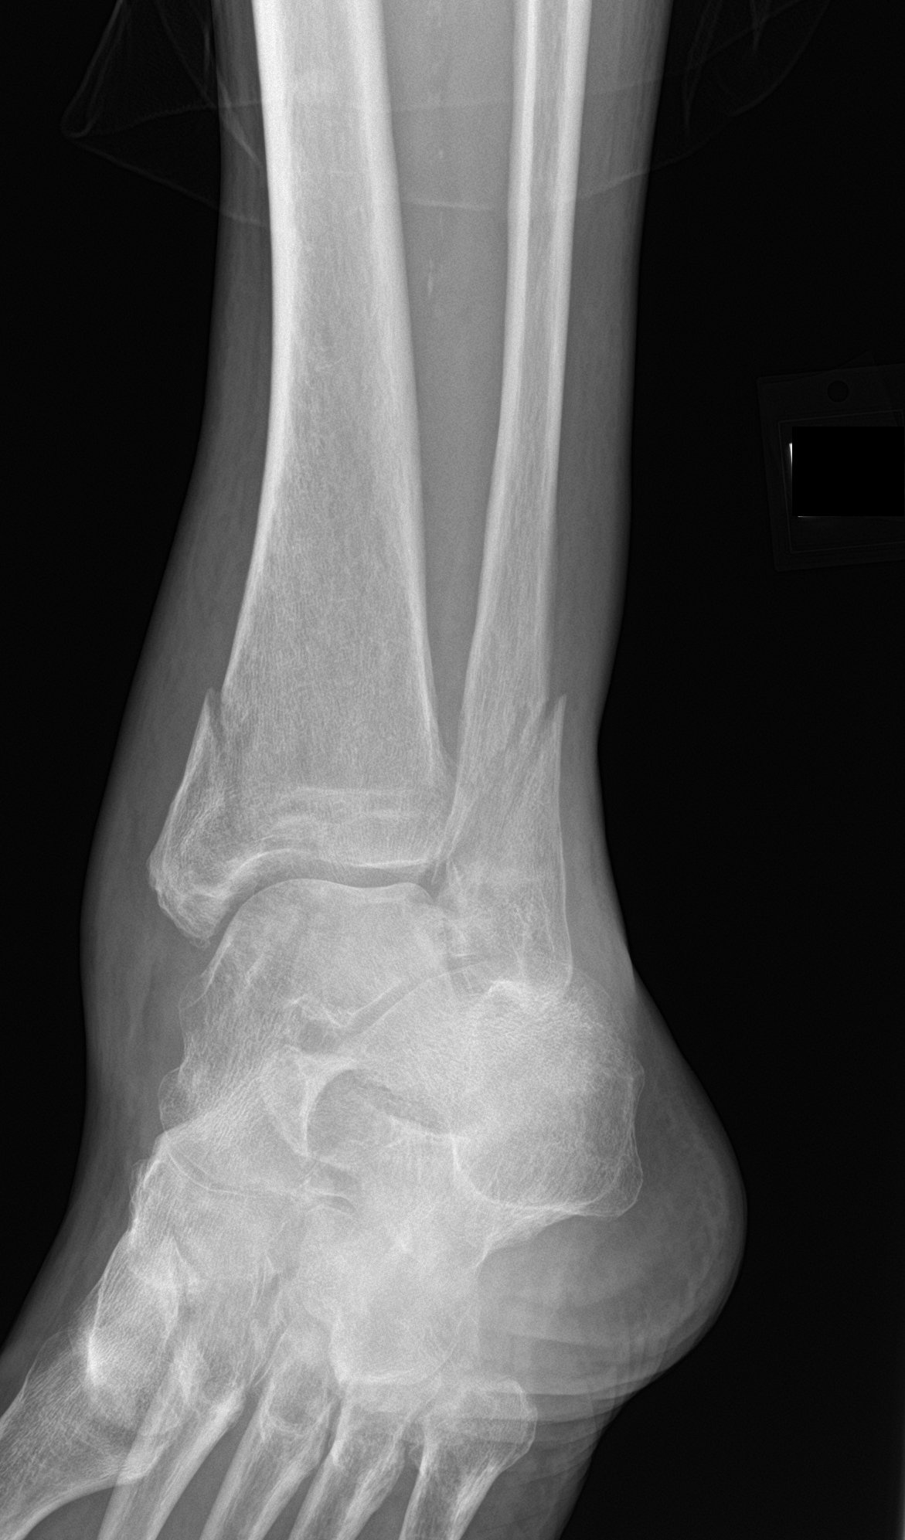
[im 3/3]
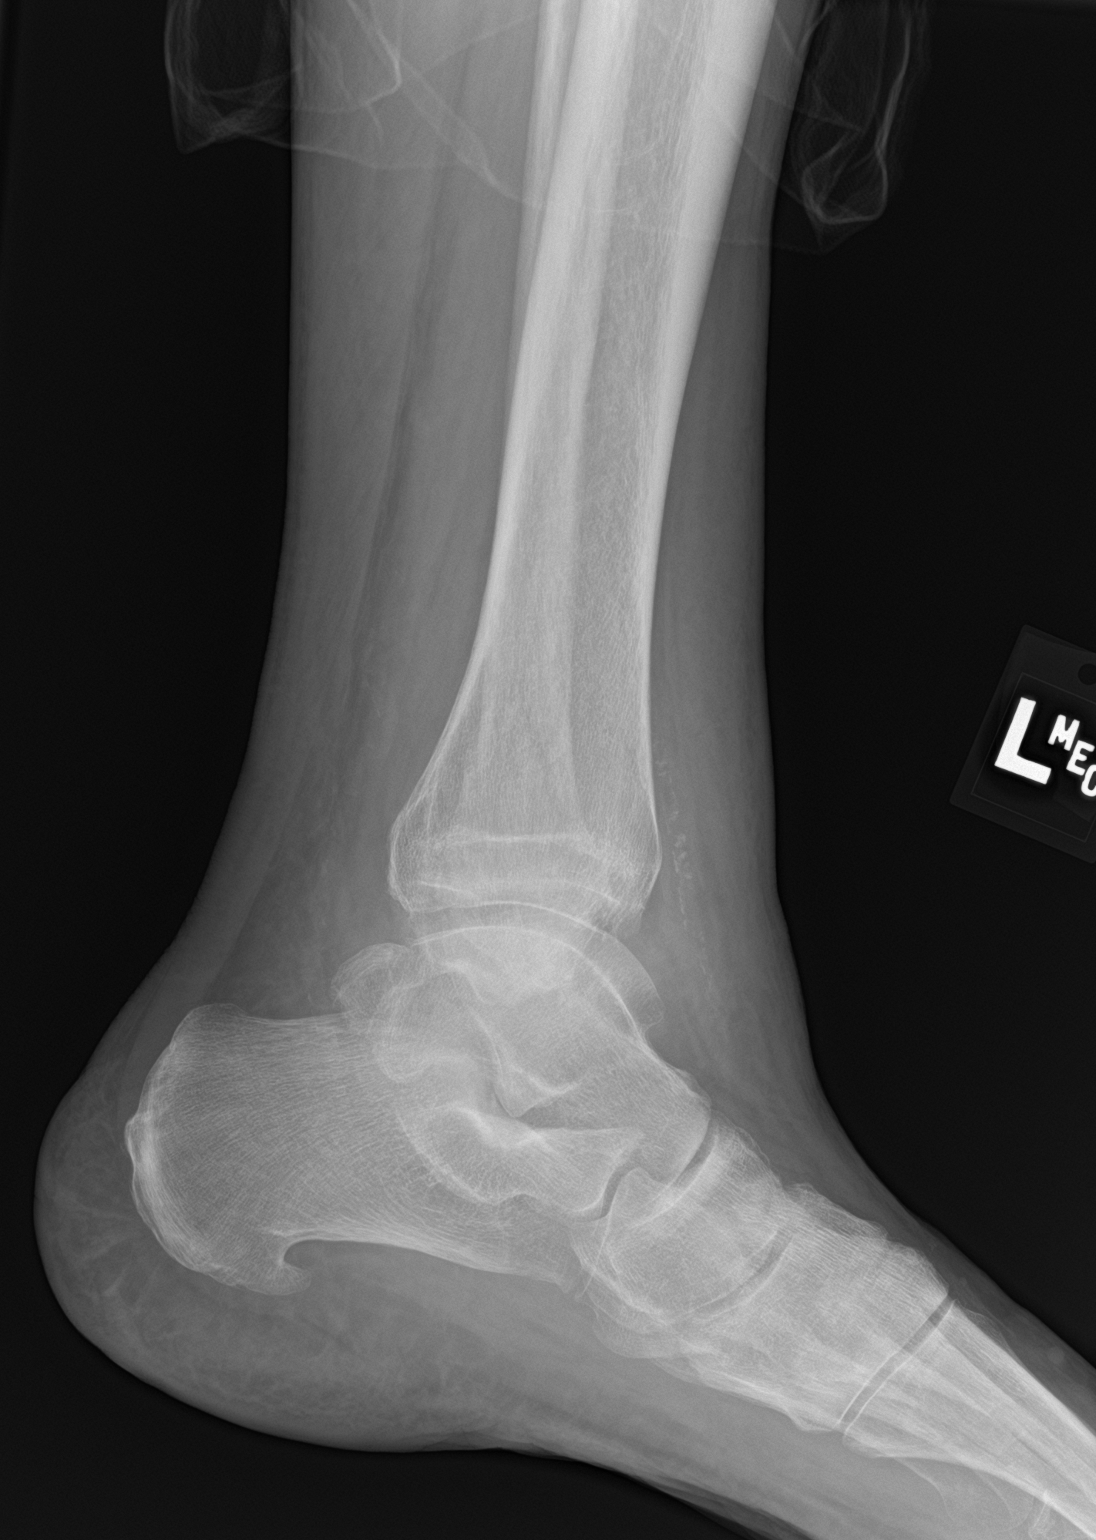

[3 of 3 positions shown; findings below may reference images not displayed]

FINDINGS: Diffuse soft tissue swelling. Slightly displaced fractures noted of
the medial malleolus and distal left fibula. Mild degenerative
change. Peripheral vascular calcification.
IMPRESSION: 1. Slightly displaced fractures noted of the medial malleolus and
distal left fibula.
2. Peripheral vascular disease.

## 2021-10-12 ENCOUNTER — Other Ambulatory Visit: Payer: Self-pay | Admitting: Infectious Diseases

## 2021-10-12 DIAGNOSIS — K7460 Unspecified cirrhosis of liver: Secondary | ICD-10-CM

## 2022-04-07 ENCOUNTER — Ambulatory Visit
Admission: RE | Admit: 2022-04-07 | Discharge: 2022-04-07 | Disposition: A | Discharge status: Home / Self Care | Payer: Medicare PPO | Source: Ambulatory Visit | Attending: Infectious Diseases | Admitting: Infectious Diseases

## 2022-04-07 DIAGNOSIS — K7460 Unspecified cirrhosis of liver: Secondary | ICD-10-CM | POA: Diagnosis present

## 2022-10-08 ENCOUNTER — Emergency Department: Payer: Medicare PPO

## 2022-10-08 ENCOUNTER — Other Ambulatory Visit: Payer: Self-pay

## 2022-10-08 ENCOUNTER — Emergency Department
Admission: EM | Admit: 2022-10-08 | Discharge: 2022-10-08 | Disposition: A | Discharge status: Home / Self Care | Payer: Medicare PPO | Attending: Emergency Medicine | Admitting: Emergency Medicine

## 2022-10-08 DIAGNOSIS — I10 Essential (primary) hypertension: Secondary | ICD-10-CM | POA: Insufficient documentation

## 2022-10-08 DIAGNOSIS — R1030 Lower abdominal pain, unspecified: Secondary | ICD-10-CM | POA: Diagnosis present

## 2022-10-08 DIAGNOSIS — Z79899 Other long term (current) drug therapy: Secondary | ICD-10-CM | POA: Insufficient documentation

## 2022-10-08 DIAGNOSIS — R110 Nausea: Secondary | ICD-10-CM

## 2022-10-08 DIAGNOSIS — Z96651 Presence of right artificial knee joint: Secondary | ICD-10-CM | POA: Insufficient documentation

## 2022-10-08 DIAGNOSIS — Z7901 Long term (current) use of anticoagulants: Secondary | ICD-10-CM | POA: Insufficient documentation

## 2022-10-08 DIAGNOSIS — K529 Noninfective gastroenteritis and colitis, unspecified: Secondary | ICD-10-CM | POA: Insufficient documentation

## 2022-10-08 LAB — COMPREHENSIVE METABOLIC PANEL
ALT: 26 U/L (ref 0–44)
AST: 30 U/L (ref 15–41)
Albumin: 4.2 g/dL (ref 3.5–5.0)
Alkaline Phosphatase: 155 U/L — ABNORMAL HIGH (ref 38–126)
Anion gap: 14 (ref 5–15)
BUN: 21 mg/dL (ref 8–23)
CO2: 21 mmol/L — ABNORMAL LOW (ref 22–32)
Calcium: 9.2 mg/dL (ref 8.9–10.3)
Chloride: 106 mmol/L (ref 98–111)
Creatinine, Ser: 0.92 mg/dL (ref 0.61–1.24)
GFR, Estimated: >60 mL/min (ref >60)
Glucose, Bld: 141 mg/dL — ABNORMAL HIGH (ref 70–99)
Potassium: 3.3 mmol/L — ABNORMAL LOW (ref 3.5–5.1)
Sodium: 141 mmol/L (ref 135–145)
Total Bilirubin: 1 mg/dL (ref 0.3–1.2)
Total Protein: 8.4 g/dL — ABNORMAL HIGH (ref 6.5–8.1)

## 2022-10-08 LAB — URINALYSIS, ROUTINE W REFLEX MICROSCOPIC
Bacteria, UA: NONE SEEN
Bilirubin Urine: NEGATIVE
Glucose, UA: NEGATIVE mg/dL
Ketones, ur: 5 mg/dL — AB
Leukocytes,Ua: NEGATIVE
Nitrite: NEGATIVE
Protein, ur: 100 mg/dL — AB
Specific Gravity, Urine: 1.011 (ref 1.005–1.030)
Squamous Epithelial / HPF: NONE SEEN /HPF (ref 0–5)
pH: 5 (ref 5.0–8.0)

## 2022-10-08 LAB — CBC
HCT: 50.9 % (ref 39.0–52.0)
Hemoglobin: 16.9 g/dL (ref 13.0–17.0)
MCH: 28.3 pg (ref 26.0–34.0)
MCHC: 33.2 g/dL (ref 30.0–36.0)
MCV: 85.1 fL (ref 80.0–100.0)
Platelets: 325 10*3/uL (ref 150–400)
RBC: 5.98 MIL/uL — ABNORMAL HIGH (ref 4.22–5.81)
RDW: 13.5 % (ref 11.5–15.5)
WBC: 20.7 10*3/uL — ABNORMAL HIGH (ref 4.0–10.5)
nRBC: 0 % (ref 0.0–0.2)

## 2022-10-08 LAB — LIPASE, BLOOD: Lipase: 58 U/L — ABNORMAL HIGH (ref 11–51)

## 2022-10-08 MED ORDER — MORPHINE SULFATE (PF) 4 MG/ML IV SOLN
4.0000 mg | Freq: Once | INTRAVENOUS | Status: AC
  Administered 2022-10-08: 4 mg via INTRAVENOUS
  Filled 2022-10-08: qty 1

## 2022-10-08 MED ORDER — SODIUM CHLORIDE 0.9 % IV BOLUS
1000.0000 mL | Freq: Once | INTRAVENOUS | Status: AC
  Administered 2022-10-08: 1000 mL via INTRAVENOUS

## 2022-10-08 MED ORDER — IOHEXOL 300 MG/ML  SOLN
100.0000 mL | Freq: Once | INTRAMUSCULAR | Status: AC | PRN
  Administered 2022-10-08: 100 mL via INTRAVENOUS

## 2022-10-08 MED ORDER — AMOXICILLIN-POT CLAVULANATE 875-125 MG PO TABS: 1.0000 | ORAL_TABLET | Freq: Two times a day (BID) | ORAL | 0 refills | Status: AC

## 2022-10-08 MED ORDER — AMOXICILLIN-POT CLAVULANATE 875-125 MG PO TABS
1.0000 | ORAL_TABLET | Freq: Once | ORAL | Status: AC
  Administered 2022-10-08: 1 via ORAL
  Filled 2022-10-08: qty 1

## 2022-10-08 MED ORDER — ONDANSETRON HCL 4 MG/2ML IJ SOLN
4.0000 mg | Freq: Once | INTRAMUSCULAR | Status: AC
  Administered 2022-10-08: 4 mg via INTRAVENOUS
  Filled 2022-10-08: qty 2

## 2022-10-08 MED ORDER — ONDANSETRON 4 MG PO TBDP: 4.0000 mg | ORAL_TABLET | Freq: Three times a day (TID) | ORAL | 0 refills | Status: AC | PRN

## 2022-10-08 NOTE — ED Notes (Signed)
Pt sleeping at this time, will reassess pain when pt is awake. Family at bedside.

## 2022-10-08 NOTE — ED Notes (Signed)
Pt given sips of water per EDP. Pt given urinal and urine cup and encouraged to give urine sample.

## 2022-10-08 NOTE — ED Provider Notes (Signed)
[facility] Emergency Department Provider Note   ____________________________________________   Event Date/Time   First MD Initiated Contact with Patient 10/08/22 1519     (approximate)  I have reviewed the triage vital signs and the nursing notes.   HISTORY  Chief Complaint Abdominal Pain    HPI Julian Villa is a 62 y.o. male presents to the ED for c/o  lower abd pain - He reports that the pain started last pm  - He had a sudden urge to have a BM but then felt constipated - After about an hour, had had a significant BM followed by loose stools - He reports nausea but no vomiting - Last pm the pain was 10/10 but now is 4/10 and all the way across his lower abd  He is avoiding drinking or taking medication because of nausea  He has not taken anything for the pain today  He has no gallbladder and has hx of gastric bypass     Past Medical History:  Diagnosis Date   Hypertension    Stroke ([place])     There are no problems to display for this patient.   Past Surgical History:  Procedure Laterality Date   REPLACEMENT TOTAL KNEE Right     Prior to Admission medications   Medication Sig Start Date End Date Taking? Authorizing Provider  amoxicillin-clavulanate (AUGMENTIN) 875-125 MG tablet Take 1 tablet by mouth 2 (two) times daily for 10 days. 10/08/22 10/18/22 Yes Willaim Rayas, NP  ondansetron (ZOFRAN-ODT) 4 MG disintegrating tablet Take 1 tablet (4 mg total) by mouth every 8 (eight) hours as needed for nausea or vomiting. 10/08/22  Yes Willaim Rayas, NP  amLODipine (NORVASC) 10 MG tablet Take 10 mg by mouth daily.    [provider]  apixaban (ELIQUIS) 5 MG TABS tablet Take 5 mg by mouth 2 (two) times daily.    [provider]  atorvastatin (LIPITOR) 80 MG tablet Take 80 mg by mouth daily.    [provider]  hydrochlorothiazide (HYDRODIURIL) 25 MG tablet Take 25 mg by mouth daily.    [provider]  levothyroxine  (SYNTHROID) 100 MCG tablet Take 100 mcg by mouth daily before breakfast.    [provider]  lisinopril (ZESTRIL) 40 MG tablet Take 40 mg by mouth daily.    [provider]  metoprolol succinate (TOPROL-XL) 100 MG 24 hr tablet Take 100 mg by mouth daily. Take with or immediately following a meal.    [provider]  NIFEdipine (ADALAT CC) 60 MG 24 hr tablet Take 60 mg by mouth daily.    [provider]    Allergies Patient has no known allergies.  History reviewed. No pertinent family history.  Social History    Review of Systems  Constitutional: No fever/chills Eyes: No visual changes. ENT: No sore throat. Cardiovascular: Denies chest pain. Respiratory: Denies shortness of breath. Gastrointestinal: Positive for nausea and abdominal pain. No vomiting.  Positive for constipation and loose stools  Genitourinary: Negative for dysuria. Musculoskeletal: Negative for back pain. Skin: Negative for rash. Neurological: Negative for headaches, focal weakness or numbness.   ____________________________________________   PHYSICAL EXAM:  VITAL SIGNS: ED Triage Vitals  Enc Vitals Group     BP 10/08/22 1503 (!) 137/106     Pulse Rate 10/08/22 1503 64     Resp 10/08/22 1503 18     Temp 10/08/22 1503 98.1 F (36.7 C)     Temp Source 10/08/22 1503  Oral     SpO2 10/08/22 1503 100 %     Weight 10/08/22 1504 230 lb (104.3 kg)     Height --      Head Circumference --      Peak Flow --      Pain Score 10/08/22 1504 6     Pain Loc --      Pain Edu? --      Excl. in [place]? --     Constitutional: Alert and oriented. Well appearing and in no acute distress. Eyes: Conjunctivae are normal. PERRL. EOMI. Head: Atraumatic. Nose: No congestion/rhinnorhea. Mouth/Throat: Mucous membranes are moist.  Oropharynx non-erythematous. Neck: No stridor.   Cardiovascular: Normal rate, regular rhythm. Grossly normal heart sounds.  Good peripheral  circulation. Respiratory: Normal respiratory effort.  No retractions. Lungs CTAB. Gastrointestinal: Soft and nontender. No distention. No abdominal bruits. No CVA tenderness.Pt does report generalized lower abd pain in the suprapubic region that "radiate everywhere" when palpated - BS are positive in all 4 quads.  No rebound tenderness or guarding. Musculoskeletal: No lower extremity tenderness nor edema.  No joint effusions. Neurologic:  Normal speech and language. No gross focal neurologic deficits are appreciated. No gait instability. Skin:  Skin is warm, dry and intact. No rash noted. Psychiatric: Mood and affect are normal. Speech and behavior are normal.  ____________________________________________   LABS (all labs ordered are listed, but only abnormal results are displayed)  Labs Reviewed  LIPASE, BLOOD - Abnormal; Notable for the following components:      Result Value   Lipase 58 (*)    All other components within normal limits  COMPREHENSIVE METABOLIC PANEL - Abnormal; Notable for the following components:   Potassium 3.3 (*)    CO2 21 (*)    Glucose, Bld 141 (*)    Total Protein 8.4 (*)    Alkaline Phosphatase 155 (*)    All other components within normal limits  CBC - Abnormal; Notable for the following components:   WBC 20.7 (*)    RBC 5.98 (*)    All other components within normal limits  URINALYSIS, ROUTINE W REFLEX MICROSCOPIC - Abnormal; Notable for the following components:   Color, Urine YELLOW (*)    APPearance CLEAR (*)    Hgb urine dipstick SMALL (*)    Ketones, ur 5 (*)    Protein, ur 100 (*)    All other components within normal limits   ____________________________________________  EKG   ____________________________________________  RADIOLOGY  ED MD interpretation: CT of abdomen reviewed by me and read by radiologist.  Official radiology report(s): CT ABDOMEN PELVIS W CONTRAST  Result Date: 10/08/2022 CLINICAL DATA:  Abdominal pain and  diarrhea. EXAM: CT ABDOMEN AND PELVIS WITH CONTRAST TECHNIQUE: Multidetector CT imaging of the abdomen and pelvis was performed using the standard protocol following bolus administration of intravenous contrast. RADIATION DOSE REDUCTION: This exam was performed according to the departmental dose-optimization program which includes automated exposure control, adjustment of the mA and/or kV according to patient size and/or use of iterative reconstruction technique. CONTRAST:  170mL OMNIPAQUE IOHEXOL 300 MG/ML  SOLN COMPARISON:  August 08, 2020 FINDINGS: Lower chest: Small pericardial effusion measuring up to 9 mm in thickness. Hepatobiliary: No focal liver abnormality is seen. Status post cholecystectomy. No biliary dilatation. Pancreas: Unremarkable. No pancreatic ductal dilatation or surrounding inflammatory changes. Spleen: Normal in size without focal abnormality. Adrenals/Urinary Tract: Normal adrenal glands. Bilateral renal cysts, including 7.7 cm benign exophytic cyst off of the midpole region of  the left kidney. Stomach/Bowel: Postsurgical changes of partial gastrectomy/gastric bypass. No evidence of small-bowel obstruction. The appendix is normal. Long segment of mucosal thickening of the descending colon and parts of the sigmoid colon with associated moderate pericolonic inflammatory changes. Vascular/Lymphatic: Heavy calcific atherosclerotic disease of the aorta and its branches. No evidence of lymphadenopathy. Reproductive: Nonenlarged prostate gland. Other: No abdominal wall hernia or abnormality. No abdominopelvic ascites. Musculoskeletal: Spondylosis of the lumbosacral spine IMPRESSION: 1. Long segment of mucosal thickening of the descending colon and parts of the sigmoid colon, suggestive of infectious, inflammatory or ischemic colitis. 2. Bilateral renal cyst. 3. Marked calcific atherosclerotic disease of the aorta and its branches. 4. Small pericardial effusion. Electronically Signed   By: Ted Mcalpine M.D.   On: 10/08/2022 18:56    ____________________________________________   PROCEDURES  Procedure(s) performed: None  Procedures  Critical Care performed: No  ____________________________________________   INITIAL IMPRESSION / ASSESSMENT AND PLAN / ED COURSE     Julian Villa is a 62 y.o. male presents to the ED for c/o  lower abd pain - He reports that the pain started last pm  - He had a sudden urge to have a BM but then felt constipated - After about an hour, had had a significant BM followed by loose stools - He reports nausea but no vomiting - Last pm the pain was 10/10 but now is 4/10 and all the way across his lower abd  He is avoiding drinking or taking medication because of nausea  He has not taken anything for the pain today  He has no gallbladder and has hx of gastric bypass   Labs are pending  Will obtain CT abd/pelvis and give fluid bolus of NACL   CBC reveals elevated white count of 20.7, lipase reveals 58, CM P shows slightly low potassium, CO2 and elevated alkaline phosphatase, protein. 2 years ago patient had elevated white count and lipase levels as well.  He was found to have liver cirrhosis without alcohol. Awaiting CT scan.  Patient states that his pain is increasing.  Will give morphine 4 mg and Zofran 4 mg.  UA resulted relatively unremarkable.  Nurse reported patient did not receive morphine or Zofran as he was asleep when she began to give medication.  I went in to reassess patient and he is asleep and in no acute distress at this time.  CT of abdomen reveals most likely inflammatory colitis. Patient is not having severe abdominal pain, blood in stool, vomiting, fever.  Patient has no pain with palpation to the abdomen.  No reason to suspect ischemic bowel. Most likely is infectious colitis and will treat as such. Prescribe Augmentin twice daily for the next 10 days. Will also give Zofran for nausea.  If patient persists with pain or pain  worsens, he is to follow-up in the emergency room or back with his primary care provider. Strict return precautions for the ED were discussed. Patient be discharged home in stable condition at this time      ____________________________________________   FINAL CLINICAL IMPRESSION(S) / ED DIAGNOSES  Final diagnoses:  Colitis  Nausea     ED Discharge Orders          Ordered    amoxicillin-clavulanate (AUGMENTIN) 875-125 MG tablet  2 times daily        10/08/22 1912    ondansetron (ZOFRAN-ODT) 4 MG disintegrating tablet  Every 8 hours PRN        10/08/22 1912  Note:  This document was prepared using Dragon voice recognition software and may include unintentional dictation errors.     Willaim Rayas, NP 10/08/22 Si Raider    Duffy Bruce, MD 10/09/22 [phone]

## 2022-10-08 NOTE — ED Triage Notes (Signed)
Pt sts that last night he started to have abd pain. Pt sts that he needed to get having a BM which he did but every since than he has had diarrhea.

## 2022-10-08 NOTE — ED Notes (Signed)
Pt with c/o lower abdominal pain since last evening. NAD at this time. Pt states he has been also having some diarrhea.

## 2022-10-08 NOTE — Discharge Instructions (Signed)
You have been seen today in the emergency room for abdominal pain.  Your CT scan shows that you most likely have infectious colitis.  You will be prescribed Augmentin to take twice a day for the next 10 days.  You are also going to be given Zofran for nausea.  I recommend that you eat a very bland diet for the next 3 to 4 days.  Follow-up with your primary care provider if symptoms persist or worsen.
